# Patient Record
Sex: Female | Born: 1960 | Race: White | Hispanic: No | Marital: Married | Smoking: Current every day smoker
Health system: Southern US, Community
[De-identification: ages and names within clinical notes are randomized; demographics above are authoritative.]

## PROBLEM LIST (undated history)

## (undated) DIAGNOSIS — M461 Sacroiliitis, not elsewhere classified: Secondary | ICD-10-CM

## (undated) DIAGNOSIS — F32A Depression, unspecified: Secondary | ICD-10-CM

## (undated) DIAGNOSIS — T50901A Poisoning by unspecified drugs, medicaments and biological substances, accidental (unintentional), initial encounter: Secondary | ICD-10-CM

## (undated) DIAGNOSIS — F419 Anxiety disorder, unspecified: Secondary | ICD-10-CM

## (undated) DIAGNOSIS — K589 Irritable bowel syndrome without diarrhea: Secondary | ICD-10-CM

## (undated) DIAGNOSIS — M170 Bilateral primary osteoarthritis of knee: Secondary | ICD-10-CM

## (undated) DIAGNOSIS — F329 Major depressive disorder, single episode, unspecified: Secondary | ICD-10-CM

## (undated) DIAGNOSIS — N879 Dysplasia of cervix uteri, unspecified: Secondary | ICD-10-CM

## (undated) DIAGNOSIS — J449 Chronic obstructive pulmonary disease, unspecified: Secondary | ICD-10-CM

## (undated) DIAGNOSIS — K219 Gastro-esophageal reflux disease without esophagitis: Secondary | ICD-10-CM

## (undated) HISTORY — DX: Bilateral primary osteoarthritis of knee: M17.0

## (undated) HISTORY — DX: Anxiety disorder, unspecified: F41.9

## (undated) HISTORY — PX: TONSILLECTOMY AND ADENOIDECTOMY: SHX28

## (undated) HISTORY — DX: Chronic obstructive pulmonary disease, unspecified: J44.9

## (undated) HISTORY — DX: Major depressive disorder, single episode, unspecified: F32.9

## (undated) HISTORY — PX: TUBAL LIGATION: SHX77

## (undated) HISTORY — DX: Poisoning by unspecified drugs, medicaments and biological substances, accidental (unintentional), initial encounter: T50.901A

## (undated) HISTORY — DX: Depression, unspecified: F32.A

## (undated) HISTORY — DX: Gastro-esophageal reflux disease without esophagitis: K21.9

## (undated) HISTORY — DX: Irritable bowel syndrome, unspecified: K58.9

## (undated) HISTORY — DX: Sacroiliitis, not elsewhere classified: M46.1

## (undated) HISTORY — DX: Dysplasia of cervix uteri, unspecified: N87.9

---

## 2000-08-21 ENCOUNTER — Other Ambulatory Visit: Admission: RE | Admit: 2000-08-21 | Discharge: 2000-08-21 | Payer: Self-pay | Admitting: Internal Medicine

## 2001-08-24 ENCOUNTER — Other Ambulatory Visit: Admission: RE | Admit: 2001-08-24 | Discharge: 2001-08-24 | Payer: Self-pay | Admitting: Internal Medicine

## 2003-07-07 ENCOUNTER — Other Ambulatory Visit: Admission: RE | Admit: 2003-07-07 | Discharge: 2003-07-07 | Payer: Self-pay | Admitting: Internal Medicine

## 2004-06-18 ENCOUNTER — Ambulatory Visit: Payer: Self-pay | Admitting: Internal Medicine

## 2004-10-14 ENCOUNTER — Ambulatory Visit: Payer: Self-pay | Admitting: Internal Medicine

## 2004-12-05 ENCOUNTER — Ambulatory Visit: Payer: Self-pay | Admitting: Internal Medicine

## 2005-03-19 ENCOUNTER — Other Ambulatory Visit: Admission: RE | Admit: 2005-03-19 | Discharge: 2005-03-19 | Payer: Self-pay | Admitting: Internal Medicine

## 2005-03-19 ENCOUNTER — Ambulatory Visit: Payer: Self-pay | Admitting: Internal Medicine

## 2005-04-08 ENCOUNTER — Ambulatory Visit: Payer: Self-pay | Admitting: Internal Medicine

## 2005-05-28 ENCOUNTER — Ambulatory Visit: Payer: Self-pay | Admitting: Internal Medicine

## 2005-09-26 ENCOUNTER — Ambulatory Visit: Payer: Self-pay | Admitting: Internal Medicine

## 2006-04-10 ENCOUNTER — Ambulatory Visit: Payer: Self-pay | Admitting: Internal Medicine

## 2006-07-29 ENCOUNTER — Telehealth (INDEPENDENT_AMBULATORY_CARE_PROVIDER_SITE_OTHER): Payer: Self-pay | Admitting: *Deleted

## 2006-10-23 ENCOUNTER — Telehealth (INDEPENDENT_AMBULATORY_CARE_PROVIDER_SITE_OTHER): Payer: Self-pay | Admitting: *Deleted

## 2006-11-11 ENCOUNTER — Encounter: Payer: Self-pay | Admitting: Internal Medicine

## 2006-11-11 DIAGNOSIS — K589 Irritable bowel syndrome without diarrhea: Secondary | ICD-10-CM

## 2006-11-11 DIAGNOSIS — M461 Sacroiliitis, not elsewhere classified: Secondary | ICD-10-CM

## 2006-11-17 ENCOUNTER — Encounter: Payer: Self-pay | Admitting: Internal Medicine

## 2006-11-17 ENCOUNTER — Ambulatory Visit: Payer: Self-pay | Admitting: Internal Medicine

## 2006-11-17 ENCOUNTER — Other Ambulatory Visit: Admission: RE | Admit: 2006-11-17 | Discharge: 2006-11-17 | Payer: Self-pay | Admitting: Internal Medicine

## 2006-11-17 DIAGNOSIS — F411 Generalized anxiety disorder: Secondary | ICD-10-CM | POA: Insufficient documentation

## 2006-11-17 DIAGNOSIS — N939 Abnormal uterine and vaginal bleeding, unspecified: Secondary | ICD-10-CM

## 2006-11-17 DIAGNOSIS — N926 Irregular menstruation, unspecified: Secondary | ICD-10-CM | POA: Insufficient documentation

## 2006-11-23 ENCOUNTER — Telehealth (INDEPENDENT_AMBULATORY_CARE_PROVIDER_SITE_OTHER): Payer: Self-pay | Admitting: *Deleted

## 2006-12-28 ENCOUNTER — Telehealth (INDEPENDENT_AMBULATORY_CARE_PROVIDER_SITE_OTHER): Payer: Self-pay | Admitting: *Deleted

## 2006-12-30 ENCOUNTER — Telehealth: Payer: Self-pay | Admitting: Internal Medicine

## 2007-01-12 ENCOUNTER — Telehealth (INDEPENDENT_AMBULATORY_CARE_PROVIDER_SITE_OTHER): Payer: Self-pay | Admitting: *Deleted

## 2007-02-15 ENCOUNTER — Telehealth (INDEPENDENT_AMBULATORY_CARE_PROVIDER_SITE_OTHER): Payer: Self-pay | Admitting: *Deleted

## 2007-03-11 ENCOUNTER — Telehealth (INDEPENDENT_AMBULATORY_CARE_PROVIDER_SITE_OTHER): Payer: Self-pay | Admitting: *Deleted

## 2007-03-16 ENCOUNTER — Telehealth (INDEPENDENT_AMBULATORY_CARE_PROVIDER_SITE_OTHER): Payer: Self-pay | Admitting: *Deleted

## 2007-04-06 ENCOUNTER — Telehealth: Payer: Self-pay | Admitting: Internal Medicine

## 2007-04-12 ENCOUNTER — Telehealth (INDEPENDENT_AMBULATORY_CARE_PROVIDER_SITE_OTHER): Payer: Self-pay | Admitting: *Deleted

## 2007-05-17 ENCOUNTER — Telehealth (INDEPENDENT_AMBULATORY_CARE_PROVIDER_SITE_OTHER): Payer: Self-pay | Admitting: *Deleted

## 2007-06-10 ENCOUNTER — Telehealth (INDEPENDENT_AMBULATORY_CARE_PROVIDER_SITE_OTHER): Payer: Self-pay | Admitting: *Deleted

## 2007-07-08 ENCOUNTER — Telehealth (INDEPENDENT_AMBULATORY_CARE_PROVIDER_SITE_OTHER): Payer: Self-pay | Admitting: *Deleted

## 2007-07-28 ENCOUNTER — Telehealth (INDEPENDENT_AMBULATORY_CARE_PROVIDER_SITE_OTHER): Payer: Self-pay | Admitting: *Deleted

## 2007-08-05 ENCOUNTER — Telehealth (INDEPENDENT_AMBULATORY_CARE_PROVIDER_SITE_OTHER): Payer: Self-pay | Admitting: *Deleted

## 2007-08-09 ENCOUNTER — Encounter: Payer: Self-pay | Admitting: Internal Medicine

## 2007-09-14 ENCOUNTER — Telehealth (INDEPENDENT_AMBULATORY_CARE_PROVIDER_SITE_OTHER): Payer: Self-pay | Admitting: *Deleted

## 2007-10-14 ENCOUNTER — Telehealth (INDEPENDENT_AMBULATORY_CARE_PROVIDER_SITE_OTHER): Payer: Self-pay | Admitting: *Deleted

## 2007-11-12 ENCOUNTER — Telehealth: Payer: Self-pay | Admitting: Family Medicine

## 2007-11-16 ENCOUNTER — Telehealth: Payer: Self-pay | Admitting: Internal Medicine

## 2007-12-27 ENCOUNTER — Ambulatory Visit: Payer: Self-pay | Admitting: Internal Medicine

## 2008-01-24 ENCOUNTER — Telehealth: Payer: Self-pay | Admitting: Internal Medicine

## 2008-02-17 ENCOUNTER — Ambulatory Visit: Payer: Self-pay | Admitting: Anesthesiology

## 2008-03-21 ENCOUNTER — Telehealth: Payer: Self-pay | Admitting: Internal Medicine

## 2008-05-31 ENCOUNTER — Telehealth: Payer: Self-pay | Admitting: Internal Medicine

## 2008-08-01 ENCOUNTER — Ambulatory Visit: Payer: Self-pay | Admitting: Anesthesiology

## 2008-08-17 ENCOUNTER — Telehealth: Payer: Self-pay | Admitting: Internal Medicine

## 2008-10-16 ENCOUNTER — Telehealth: Payer: Self-pay | Admitting: Internal Medicine

## 2008-11-23 ENCOUNTER — Telehealth: Payer: Self-pay | Admitting: Internal Medicine

## 2008-11-23 ENCOUNTER — Encounter: Payer: Self-pay | Admitting: Internal Medicine

## 2008-12-13 ENCOUNTER — Ambulatory Visit: Payer: Self-pay | Admitting: Anesthesiology

## 2009-06-01 ENCOUNTER — Ambulatory Visit: Payer: Self-pay | Admitting: Anesthesiology

## 2011-10-31 ENCOUNTER — Ambulatory Visit (INDEPENDENT_AMBULATORY_CARE_PROVIDER_SITE_OTHER): Payer: Self-pay | Admitting: Internal Medicine

## 2011-10-31 ENCOUNTER — Encounter: Payer: Self-pay | Admitting: Internal Medicine

## 2011-10-31 VITALS — BP 117/87 | HR 82 | Temp 98.1°F | Ht 62.0 in | Wt 134.2 lb

## 2011-10-31 DIAGNOSIS — F329 Major depressive disorder, single episode, unspecified: Secondary | ICD-10-CM

## 2011-10-31 DIAGNOSIS — N879 Dysplasia of cervix uteri, unspecified: Secondary | ICD-10-CM | POA: Insufficient documentation

## 2011-10-31 DIAGNOSIS — M461 Sacroiliitis, not elsewhere classified: Secondary | ICD-10-CM

## 2011-10-31 DIAGNOSIS — F339 Major depressive disorder, recurrent, unspecified: Secondary | ICD-10-CM | POA: Insufficient documentation

## 2011-10-31 DIAGNOSIS — M170 Bilateral primary osteoarthritis of knee: Secondary | ICD-10-CM | POA: Insufficient documentation

## 2011-10-31 DIAGNOSIS — F411 Generalized anxiety disorder: Secondary | ICD-10-CM

## 2011-10-31 DIAGNOSIS — J449 Chronic obstructive pulmonary disease, unspecified: Secondary | ICD-10-CM | POA: Insufficient documentation

## 2011-10-31 DIAGNOSIS — K219 Gastro-esophageal reflux disease without esophagitis: Secondary | ICD-10-CM | POA: Insufficient documentation

## 2011-10-31 MED ORDER — HYDROCODONE-ACETAMINOPHEN 5-325 MG PO TABS: 1.0000 | ORAL_TABLET | Freq: Three times a day (TID) | ORAL | Status: AC | PRN

## 2011-10-31 MED ORDER — CARISOPRODOL 350 MG PO TABS: 350.0000 mg | ORAL_TABLET | Freq: Three times a day (TID) | ORAL | Status: AC | PRN

## 2011-10-31 MED ORDER — CITALOPRAM HYDROBROMIDE 20 MG PO TABS: 20.0000 mg | ORAL_TABLET | Freq: Every day | ORAL | Status: DC

## 2011-10-31 NOTE — Patient Instructions (Addendum)
We will plan to repeat your pap smear at the next visit Please check into the [place] reduced payment plan

## 2011-10-31 NOTE — Assessment & Plan Note (Signed)
Ongoing Will try the citalopram Will not give xanax again unless she goes off the soma

## 2011-10-31 NOTE — Assessment & Plan Note (Signed)
Chronic ongoing pain Will restart hydrocodone and soma---in lower doses Discussed dependence concerns

## 2011-10-31 NOTE — Progress Notes (Signed)
Subjective:    Patient ID: Vanessa Turner, female    DOB: [birth year], 51 y.o.   MRN: [id number]  HPI Reestablishing here Has relocated to this area  Ongoing medical issues Chronic sacroiliitis Now with knee osteoarthritis as well Was Rx with soma and hydrocodone by MD around [facility] where she was living This did help but she has been out of meds for a while  Ongoing anxiety Xanax in past Also with chronic depression Worse since daughter's death in 12/11---had severe CP and put her in home at urging of her brother and sister. Multiple complications from aspiration and finally died Ongoing guilt due to this  Trouble with mouth and teeth Needs plate On penicillin  Ongoing breathing problems Still smoking Had to retire from job recently due to dyspnea--could bring mop up 3 staircases as was required Still smokes and not ready to stop  No current outpatient prescriptions on file prior to visit.    Allergies  Allergen Reactions  . Cephalexin     REACTION: unspecified  . Clarithromycin     REACTION: unspecified  . Fluoxetine Hcl     REACTION: increasd depression  . Paroxetine     REACTION: no help  . Prednisone     REACTION: unspecified    Past Medical History  Diagnosis Date  . GERD (gastroesophageal reflux disease)   . Anxiety   . Depression     since daughter's death March 20, 2023  . Sacroiliitis     chronic--has needed pain management  . IBS (irritable bowel syndrome)   . Osteoarthritis of both knees   . COPD (chronic obstructive pulmonary disease)     Past Surgical History  Procedure Date  . Tubal ligation   . Tonsillectomy and adenoidectomy     Family History  Problem Relation Age of Onset  . Heart disease Neg Hx   . Hypertension Neg Hx     History   Social History  . Marital Status: Legally Separated    Spouse Name: N/A    Number of Children: 2  . Years of Education: N/A   Occupational History  . Out of work --[employer] in past    Social  History Main Topics  . Smoking status: Current Everyday Smoker  . Smokeless tobacco: Not on file  . Alcohol Use: No  . Drug Use: No  . Sexually Active: Not on file   Other Topics Concern  . Not on file   Social History Narrative   Still legally seperatedSon living with herLooking for workDaughter died 2023-03-20   Review of Systems  Constitutional: Positive for fatigue. Negative for unexpected weight change.       Some tired feelings she relates to depression  Respiratory: Positive for cough, chest tightness and shortness of breath.        Chest tightness due to dyspnea  Cardiovascular: Negative for chest pain and palpitations.  Gastrointestinal: Negative for nausea, abdominal pain and constipation.       Uses occ gaviscon for reflux  Genitourinary: Negative for dysuria and urgency.  Musculoskeletal: Positive for back pain and arthralgias. Negative for joint swelling.  Skin: Negative for color change and rash.  Neurological: Positive for headaches. Negative for dizziness, syncope and light-headedness.       Occ headaches  Hematological: Negative for adenopathy. Does not bruise/bleed easily.  Psychiatric/Behavioral: Positive for disturbed wake/sleep cycle and dysphoric mood. The patient is nervous/anxious.        Objective:   Physical Exam  Constitutional: She appears well-developed. No  distress.  HENT:  Mouth/Throat: Oropharynx is clear and moist. No oropharyngeal exudate.  Eyes: Conjunctivae and EOM are normal.  Neck: Normal range of motion. Neck supple. No thyromegaly present.  Cardiovascular: Normal rate, regular rhythm and normal heart sounds.  Exam reveals no gallop.   No murmur heard. Pulmonary/Chest: Effort normal and breath sounds normal. No respiratory distress. She has no wheezes. She has no rales.  Abdominal: Soft. There is no tenderness.  Musculoskeletal: She exhibits no edema.       Mild S-I tenderness  Lymphadenopathy:    She has no cervical adenopathy.  Skin:  No rash noted.  Psychiatric:       Appears depressed Affect somewhat flattened          Assessment & Plan:

## 2011-10-31 NOTE — Assessment & Plan Note (Signed)
Long standing Did well with effexor and lexapro in past Will restart citalopram

## 2011-11-30 DIAGNOSIS — T50901A Poisoning by unspecified drugs, medicaments and biological substances, accidental (unintentional), initial encounter: Secondary | ICD-10-CM

## 2011-11-30 HISTORY — DX: Poisoning by unspecified drugs, medicaments and biological substances, accidental (unintentional), initial encounter: T50.901A

## 2011-12-02 ENCOUNTER — Other Ambulatory Visit: Payer: Self-pay | Admitting: *Deleted

## 2011-12-02 ENCOUNTER — Encounter: Payer: Self-pay | Admitting: Internal Medicine

## 2011-12-02 ENCOUNTER — Ambulatory Visit (INDEPENDENT_AMBULATORY_CARE_PROVIDER_SITE_OTHER): Payer: Self-pay | Admitting: Internal Medicine

## 2011-12-02 VITALS — BP 100/60 | HR 82 | Temp 98.0°F | Ht 62.0 in | Wt 125.0 lb

## 2011-12-02 DIAGNOSIS — M461 Sacroiliitis, not elsewhere classified: Secondary | ICD-10-CM

## 2011-12-02 DIAGNOSIS — F411 Generalized anxiety disorder: Secondary | ICD-10-CM

## 2011-12-02 MED ORDER — HYDROCODONE-ACETAMINOPHEN 10-325 MG PO TABS: 1.0000 | ORAL_TABLET | Freq: Three times a day (TID) | ORAL | Status: AC | PRN

## 2011-12-02 MED ORDER — CITALOPRAM HYDROBROMIDE 40 MG PO TABS: 40.0000 mg | ORAL_TABLET | Freq: Every day | ORAL | Status: DC

## 2011-12-02 MED ORDER — ALPRAZOLAM 0.25 MG PO TABS: 0.2500 mg | ORAL_TABLET | Freq: Three times a day (TID) | ORAL | Status: AC | PRN

## 2011-12-02 NOTE — Progress Notes (Signed)
  Subjective:    Patient ID: Vanessa Turner, female    DOB: Mar 12, 1961, 51 y.o.   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 citalopram (CELEXA) 20 MG tablet Take 1 tablet (20 mg total) by mouth daily.  30 tablet  11  . penicillin v potassium (VEETID) 500 MG tablet Take 500 mg by mouth 4 (four) times daily.        Allergies  Allergen Reactions  . Cephalexin     REACTION: unspecified  . Clarithromycin     REACTION: unspecified  . Fluoxetine Hcl     REACTION: increasd depression  . Paroxetine     REACTION: no help  . Prednisone     REACTION: unspecified    Past Medical History  Diagnosis Date  . GERD (gastroesophageal reflux disease)   . Anxiety   . Depression     since daughter's death 14-Mar-2023  . Sacroiliitis     chronic--has needed pain management  . IBS (irritable bowel syndrome)   . Osteoarthritis of both knees   . COPD (chronic obstructive pulmonary disease)   . Cervical dysplasia     cryotherapy 2009    Past Surgical History  Procedure Date  . Tubal ligation   . Tonsillectomy and adenoidectomy     Family History  Problem Relation Age of Onset  . Heart disease Neg Hx   . Hypertension Neg Hx     History   Social History  . Marital Status: Legally Separated    Spouse Name: N/A    Number of Children: 2  . Years of Education: N/A   Occupational History  . Out of work --[employer] in past    Social History Main  Topics  . Smoking status: Current Everyday Smoker  . Smokeless tobacco: Never Used   Comment: gave 1-800-quit-now  . Alcohol Use: No  . Drug Use: No  . Sexually Active: Not on file   Other Topics Concern  . Not on file   Social History Narrative   Still legally seperatedSon living with herLooking for workDaughter died 14-Mar-2023   Review of Systems Eating is not great Weight stable    Objective:   Physical Exam  Psychiatric:       Still anxious dysthymic          Assessment & Plan:

## 2011-12-02 NOTE — Assessment & Plan Note (Signed)
Ongoing problems Little response to citalopram---will increase dose Xanax again (no soma anymore)

## 2011-12-02 NOTE — Patient Instructions (Signed)
Please try the [facility] for your care. If this doesn't work out, call for an appt here again

## 2011-12-02 NOTE — Assessment & Plan Note (Signed)
Will increase the hydrocodone dose

## 2011-12-25 ENCOUNTER — Inpatient Hospital Stay: Payer: Self-pay | Admitting: Psychiatry

## 2011-12-25 ENCOUNTER — Telehealth: Payer: Self-pay | Admitting: *Deleted

## 2011-12-25 NOTE — Telephone Encounter (Signed)
Okay I will check on her there

## 2011-12-25 NOTE — Telephone Encounter (Signed)
Call from [facility] requesting last office notes and med list for patient, she's being admitted for overdose. Did receive record release form, records faxed.

## 2011-12-26 ENCOUNTER — Telehealth: Payer: Self-pay | Admitting: Internal Medicine

## 2011-12-26 NOTE — Telephone Encounter (Signed)
Spoke to patient at [facility] behavioral med Overdose of soma--took due to pain No suicidal ideation Hopes to leave soon Has follow up next Thursday  Then call from Dr Marciano Sequin Kapur--psychiatrist Committed due to overdose Bought the soma on the street Probably will leave on Monday Getting set up with reduced fee psychiatric services and counselor Didn't want to give her hydrocodone if I wasn't going to continue it I have treated her for a long time and she has a long history of pain management care I will only Rx hydrocodone---not xanax or soma  Also on probation for DUI

## 2011-12-30 ENCOUNTER — Ambulatory Visit: Payer: Self-pay | Admitting: Internal Medicine

## 2011-12-30 HISTORY — PX: SPLENECTOMY, TOTAL: SHX788

## 2012-01-01 ENCOUNTER — Ambulatory Visit (INDEPENDENT_AMBULATORY_CARE_PROVIDER_SITE_OTHER): Payer: Self-pay | Admitting: Internal Medicine

## 2012-01-01 ENCOUNTER — Encounter: Payer: Self-pay | Admitting: Internal Medicine

## 2012-01-01 VITALS — BP 134/92 | HR 72 | Temp 99.0°F | Wt 128.5 lb

## 2012-01-01 DIAGNOSIS — K044 Acute apical periodontitis of pulpal origin: Secondary | ICD-10-CM

## 2012-01-01 DIAGNOSIS — K047 Periapical abscess without sinus: Secondary | ICD-10-CM | POA: Insufficient documentation

## 2012-01-01 DIAGNOSIS — M461 Sacroiliitis, not elsewhere classified: Secondary | ICD-10-CM

## 2012-01-01 DIAGNOSIS — T50901A Poisoning by unspecified drugs, medicaments and biological substances, accidental (unintentional), initial encounter: Secondary | ICD-10-CM

## 2012-01-01 MED ORDER — AMOXICILLIN 500 MG PO TABS: 1000.0000 mg | ORAL_TABLET | Freq: Two times a day (BID) | ORAL | Status: DC

## 2012-01-01 MED ORDER — HYDROCODONE-ACETAMINOPHEN 10-325 MG PO TABS: 1.0000 | ORAL_TABLET | Freq: Three times a day (TID) | ORAL | Status: DC | PRN

## 2012-01-01 NOTE — Assessment & Plan Note (Signed)
Chronic pain Considering going back to pain clinic at [facility] I will continue to prescribe 90 hydrocodone monthly

## 2012-01-01 NOTE — Assessment & Plan Note (Signed)
Accidental overdose she claims No suicidal ideation Now getting psychiatry follow up and psychologist

## 2012-01-01 NOTE — Assessment & Plan Note (Signed)
Will Rx amoxil Can't afford definitive care

## 2012-01-01 NOTE — Progress Notes (Signed)
  Subjective:    Patient ID: Vanessa Turner, female    DOB: Feb 10, 1961, 51 y.o.   MRN: [id number]  HPI Had overdose of soma Took too much due to pain--kept taking it Hospitalized for several days in behavioral med Likely goes back to grieving and guilt over daughter's death  Has gone for intake for psychiatrist and counselor  Has infected tooth Due for dentist appt with Dr Blima Rich likely need them extracted Just got bad last night  Current Outpatient Prescriptions on File Prior to Visit  Medication Sig Dispense Refill  . QUEtiapine Fumarate (SEROQUEL XR) 150 MG 24 hr tablet Take 150 mg by mouth at bedtime.      . ALPRAZolam (XANAX) 0.25 MG tablet Take 1 tablet (0.25 mg total) by mouth 3 (three) times daily as needed for sleep.  90 tablet  0  . citalopram (CELEXA) 40 MG tablet Take 1 tablet (40 mg total) by mouth daily.  30 tablet  3  . penicillin v potassium (VEETID) 500 MG tablet Take 500 mg by mouth 4 (four) times daily.        Allergies  Allergen Reactions  . Cephalexin     REACTION: unspecified  . Clarithromycin     REACTION: unspecified  . Fluoxetine Hcl     REACTION: increasd depression  . Paroxetine     REACTION: no help  . Prednisone     REACTION: unspecified    Past Medical History  Diagnosis Date  . GERD (gastroesophageal reflux disease)   . Anxiety   . Depression     since daughter's death 03-17-23  . Sacroiliitis     chronic--has needed pain management  . IBS (irritable bowel syndrome)   . Osteoarthritis of both knees   . COPD (chronic obstructive pulmonary disease)   . Cervical dysplasia     cryotherapy 2009  . Overdose of drug 9/13    soma--?accidental    Past Surgical History  Procedure Date  . Tubal ligation   . Tonsillectomy and adenoidectomy     Family History  Problem Relation Age of Onset  . Heart disease Neg Hx   . Hypertension Neg Hx     History   Social History  . Marital Status: Legally Separated    Spouse Name: N/A   Number of Children: 2  . Years of Education: N/A   Occupational History  . Out of work --[employer] in past    Social History Main Topics  . Smoking status: Current Every Day Smoker  . Smokeless tobacco: Never Used   Comment: gave 1-800-quit-now  . Alcohol Use: No  . Drug Use: No  . Sexually Active: Not on file   Other Topics Concern  . Not on file   Social History Narrative   Still legally seperatedSon living with herLooking for workDaughter died 03-17-23   Review of Systems     Objective:   Physical Exam  Constitutional: She appears well-developed and well-nourished.  HENT:       Mandibular swelling on left with mild tenderness Redness at left rear molar with some tenderness  Psychiatric: She has a normal mood and affect. Her behavior is normal.          Assessment & Plan:

## 2012-01-09 ENCOUNTER — Ambulatory Visit: Payer: Self-pay | Admitting: Internal Medicine

## 2012-01-26 ENCOUNTER — Inpatient Hospital Stay: Payer: Self-pay | Admitting: Surgery

## 2012-01-27 ENCOUNTER — Other Ambulatory Visit: Payer: Self-pay | Admitting: *Deleted

## 2012-01-27 MED ORDER — HYDROCODONE-ACETAMINOPHEN 10-325 MG PO TABS: 1.0000 | ORAL_TABLET | Freq: Three times a day (TID) | ORAL | Status: DC | PRN

## 2012-01-27 NOTE — Addendum Note (Signed)
Addended by: Sueanne Margarita on: 01/27/2012 03:40 PM   Modules accepted: Orders

## 2012-01-27 NOTE — Telephone Encounter (Signed)
Okay #90 x 0 

## 2012-01-27 NOTE — Telephone Encounter (Signed)
rx called into pharmacy

## 2012-01-30 ENCOUNTER — Ambulatory Visit: Payer: Self-pay | Admitting: Internal Medicine

## 2012-02-27 ENCOUNTER — Telehealth: Payer: Self-pay | Admitting: *Deleted

## 2012-02-27 NOTE — Telephone Encounter (Signed)
Decline to refill.  Too early - I believe I understand Dr. Karle Starch prescribing patterns well enough to know this is how he would like this handled.    (Notes reviewed and recent OD noted)

## 2012-02-27 NOTE — Telephone Encounter (Signed)
Spoke with patient and advised results, she doesn't like it but she will wait for Dr.[name]. 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

## 2012-02-27 NOTE — Telephone Encounter (Signed)
Patient calling asking for a early refill on Hydrocodone, patient got refills 01/01/12 & 01/27/12 patient not due until 03/02/12 and she knows this, but was asking for refill today. I advised pt that I couldn't promise that another physican could do this (see last OV)

## 2012-02-28 NOTE — Telephone Encounter (Signed)
Please call her If she had surgery, she should be able to get a refill from the surgeon

## 2012-02-29 ENCOUNTER — Ambulatory Visit: Payer: Self-pay | Admitting: Internal Medicine

## 2012-03-01 ENCOUNTER — Other Ambulatory Visit: Payer: Self-pay | Admitting: Internal Medicine

## 2012-03-01 ENCOUNTER — Other Ambulatory Visit: Payer: Self-pay

## 2012-03-01 NOTE — Telephone Encounter (Signed)
She notes that all these doctors are part of the same medical practice  I  told her she should still have the hydrocodone that I Rx'd 10/28 She states that she was in hospital then and she didn't pick it up I told her that if she didn't pick it up and someone else did without her permission---we will need to call the police  Told her I couldn't refill any pain meds now She will look into going back to the pain clinic at [facility] She will let me know if she needs a referral  Adrienne,  Please check the log for the October hydrocodone prescription for her.  If someone signed her name, we need to call the police to report this (or at least talk to Arlys John about how we should handle it---might want to review it with her again before we call)  Also, please cancel her appt for tomorrow

## 2012-03-01 NOTE — Telephone Encounter (Signed)
Pt left v/m requesting refill hydrocodone apap; pt has had recent surgery and taking med more often due to surgery. Pt request refill to [facility]. Pt request call back.

## 2012-03-01 NOTE — Telephone Encounter (Signed)
See phone note / refill request from 02/27/2012

## 2012-03-01 NOTE — Telephone Encounter (Signed)
Spoke with [facility] and pt was seen and released on 02/25/2012 pt got refill of Oxycodone 5-325 take 1 q6h got #40 also diazepam 5mg  1 q12h got #20, per [facility] pt is to follow up with [facility] pain center. I called [facility] pain center and pt has not been there since 2008. [facility] will fax over office notes, pt is to only follow-up with as needed but she was released because she was doing better. Pt was last seen by Dr. Alphonsus Sias on 01/01/2012 for overdose of Soma. Please advise on refill, pt came in office and left, stated she couldn't wait. Pt would like call back.

## 2012-03-01 NOTE — Telephone Encounter (Signed)
Patient was very upset, she wanted to schedule an appt with Dr.[name] but patient does not have any insurance and would need at least $125.00 to be seen because pt already have a balance. Per Hansel Starling pt would need to pay something in order to be seen and per pt she doesn't have any money, just enough to get her rx's filled.  Pt states she wants Dr.[name] to call her personally. Pt states the surgeon office will not give her anymore refills and they need to come from [facility]. I explained to pt that she was getting multiple refills from other physicians and she states that she didn't pick those rx's up, because she was in the hospital. [facility] confirmed that rx's where picked up. Patient states she will be waiting for a call from Dr.[name] if not she will be back.[phone]

## 2012-03-01 NOTE — Telephone Encounter (Signed)
Call from [facility] in [place] stating that pt is getting multiple refills:  02/09/12 Dr.Cooper-- percocet 02/11/12 Dr. Colette Ribas----- percocet 02/19/12 Dr. Juliann Pulse Vicodin 5-325  Also on 01/27/12 pt got #90 vicodin from Dr.[name] not sure where this was filled at, last refill at [facility] was 10/31/2011

## 2012-03-01 NOTE — Telephone Encounter (Signed)
Per patient she had her spleen taken out before halloween, she is seeing the surgeon for follow-up and per pt they have given her all the refills she can get, pt states she can get a refill tomorrow but was hoping Dr.[name] could refill early. Pt states she's seeing Dr. Christa See? Is that with [facility]? There's no notes from [facility], should I call for notes? Please advise

## 2012-03-01 NOTE — Telephone Encounter (Signed)
Please let her know that I cannot prescribe any narcotics because she is getting meds from too many people She should establish with the pain specialist and they would be the only one to prescribe any narcotics

## 2012-03-02 ENCOUNTER — Encounter: Payer: Self-pay | Admitting: Internal Medicine

## 2012-03-02 ENCOUNTER — Ambulatory Visit: Payer: Self-pay | Admitting: Internal Medicine

## 2012-03-02 NOTE — Telephone Encounter (Signed)
Spoke with pharmacist at [facility] and the rx on 01/27/12 was phoned in to [facility] which has been closed for about 2 weeks. [facility] took [facility]. Pharmacist states that rx was phoned in on 01/27/12 and picked up on 02/03/2012 pt paid $31.95 for rx, they would not fill until 02/03/12 because pt had just had another rx on 01/03/12 so it was too soon.

## 2012-03-03 ENCOUNTER — Telehealth: Payer: Self-pay

## 2012-03-03 DIAGNOSIS — M461 Sacroiliitis, not elsewhere classified: Secondary | ICD-10-CM

## 2012-03-03 NOTE — Telephone Encounter (Signed)
Pt left v/m requesting referral to pain clinic (pt cannot go to [facility] pain clinic due to insurance and cost of visits). Pt also request pain med until can be seen at pain clinic. Pt request call back.

## 2012-03-04 MED ORDER — HYDROCODONE-ACETAMINOPHEN 10-325 MG PO TABS: 1.0000 | ORAL_TABLET | Freq: Three times a day (TID) | ORAL | Status: DC | PRN

## 2012-03-04 NOTE — Telephone Encounter (Signed)
Yes  Don't know what happened to the note I wrote Referral made also  Phoned in #30 of norco Will not fill any more than this at a time Awaiting pain evaluation

## 2012-03-04 NOTE — Telephone Encounter (Signed)
Dr.[name] did you ok pain medication for this patient ? Please advise

## 2012-03-11 ENCOUNTER — Other Ambulatory Visit: Payer: Self-pay

## 2012-03-11 MED ORDER — HYDROCODONE-ACETAMINOPHEN 10-325 MG PO TABS: 1.0000 | ORAL_TABLET | Freq: Three times a day (TID) | ORAL | Status: DC | PRN

## 2012-03-11 NOTE — Telephone Encounter (Signed)
Pt request refill Hydrocodone-apap to [facility]. Pt still has med but will run out over week end. Pt has no insurance; Vanessa Turner working on pain mgt appt at [employer].Please advise. Pt request call back when med called in.

## 2012-03-11 NOTE — Telephone Encounter (Signed)
See phone note

## 2012-03-11 NOTE — Telephone Encounter (Signed)
Okay to phone in another #30 x 0 of norco Won't be due again till 12/24

## 2012-03-11 NOTE — Addendum Note (Signed)
Addended by: Patience Musca on: 03/11/2012 09:15 AM   Modules accepted: Orders

## 2012-03-11 NOTE — Telephone Encounter (Addendum)
Pt request refill Hydrocodone-apap to [facility]. Pt still has med but will run out over week end. Pt has no insurance; Armando Reichert working on pain mgt appt at [facility] [employer].Please advise. Pt request call back when med called in. Had to start new phone note to list med under meds and orders section.

## 2012-03-11 NOTE — Telephone Encounter (Signed)
rx called into pharmacy Spoke with patient and advised results   

## 2012-03-22 ENCOUNTER — Other Ambulatory Visit: Payer: Self-pay

## 2012-03-22 ENCOUNTER — Other Ambulatory Visit: Payer: Self-pay | Admitting: Internal Medicine

## 2012-03-22 NOTE — Telephone Encounter (Signed)
Pt left v/m requesting status of refill; pt request call back at [phone].

## 2012-03-22 NOTE — Telephone Encounter (Signed)
rx called into pharmacy Spoke with Shirlee Limerick and she has done the referral to [facility] pain clinic and they will contact patient in 2 weeks, referral faxed 03/11/12.  Spoke with patient and she haven't heard anything from [facility], but she wanted to make sure she could continue to get refills until the pain clinic appt

## 2012-03-22 NOTE — Telephone Encounter (Signed)
Okay #30 x 0 Find out if she at least got an appt with pain center (I know it might be a couple of months away)

## 2012-03-22 NOTE — Telephone Encounter (Signed)
Pt left v/m requesting refill hydrocodone-apap to [facility].Please advise.

## 2012-03-22 NOTE — Telephone Encounter (Signed)
Pt left v/m requesting status of refill request and pt wants call back at [phone].

## 2012-03-22 NOTE — Telephone Encounter (Signed)
This is a duplicate Okay #30 x 0 Find out if she got a pain center appt

## 2012-03-29 ENCOUNTER — Telehealth: Payer: Self-pay | Admitting: Internal Medicine

## 2012-03-29 NOTE — Telephone Encounter (Signed)
Called [facility], they have received our request for Referral and patient is on a waiting list, they will be contacting the patient directly and they said it could take months to get her an appt maybe after March 2014. Called the patient also to ask her to call Dr Alphonsus Sias as soon as they ca;; her with an appt with the [facility].

## 2012-04-01 ENCOUNTER — Other Ambulatory Visit: Payer: Self-pay | Admitting: Internal Medicine

## 2012-04-01 NOTE — Telephone Encounter (Signed)
Pt left v/m requesting status of hydrocodone refill; pt request call back at [phone].

## 2012-04-01 NOTE — Telephone Encounter (Signed)
Caller: Vanessa Turner/Patient; Phone: [phone]; Reason for Call: Time for renewel for Hydrocodone 10j/325.  [facility] [address]  Has faxed a request but not heard anything.  Nothing noted in EPIC EMR regarding refill request.  Pt.  Has been referred to Pain Clinic at [facility] but per EPIC note may beMarch 2014 before appt.  Currently getting a 10 day supply but asks if possible for expense purposes if she could get a 30 day supply at a time as it's $35 compared to $20 for 10 days.  Pt.  Relays that at time of return call she was in her car to drive to office and see about same request.  She has dose for 04/02/12 but then will be out.

## 2012-04-01 NOTE — Telephone Encounter (Signed)
Pt left note stating what CAN advises. Pt request call back at [phone].

## 2012-04-01 NOTE — Telephone Encounter (Signed)
Patient walked in office asking that refill be done now, front desk staff advised to patient that Dr.[name] was out and rx was routed to another provider, pt didn't like that and asked that refill be done now, again we advised that rx was routed to another provider and she would give [place] 24-48 hours for refills.

## 2012-04-01 NOTE — Telephone Encounter (Signed)
letvak patient

## 2012-04-02 NOTE — Telephone Encounter (Signed)
Rx phoned to pharmacy.  Note deferred to Dr. Alphonsus Sias for review.

## 2012-04-02 NOTE — Telephone Encounter (Signed)
I can do a 10 day supply and then defer to Dr. Alphonsus Sias.  Please call in. Thanks.

## 2012-04-03 NOTE — Telephone Encounter (Signed)
Please call her If she ever walks in again asking for immediate Rx, I will not ever refill it again Her misuse of the narcotics was not our doing, and I have been forced to limit her supply. She should call for refills only---if she stops this negative behavior I may extend to a larger supply. If she ever comes back in demanding an immediate refill, I will permanently discharge her from the practice!!!

## 2012-04-06 NOTE — Telephone Encounter (Signed)
Spoke with patient and advised results   

## 2012-04-09 ENCOUNTER — Other Ambulatory Visit: Payer: Self-pay | Admitting: Family Medicine

## 2012-04-09 NOTE — Telephone Encounter (Signed)
rx called into pharmacy

## 2012-04-22 ENCOUNTER — Other Ambulatory Visit: Payer: Self-pay | Admitting: Internal Medicine

## 2012-04-22 NOTE — Telephone Encounter (Signed)
Pt left v/m requesting refill hydrocodone apap; pt has not got call back from [facility] pain clinic yet.

## 2012-04-23 NOTE — Telephone Encounter (Signed)
Okay #45 x 0 

## 2012-04-23 NOTE — Telephone Encounter (Signed)
rx called into pharmacy

## 2012-05-06 ENCOUNTER — Other Ambulatory Visit: Payer: Self-pay | Admitting: Internal Medicine

## 2012-05-06 ENCOUNTER — Other Ambulatory Visit: Payer: Self-pay

## 2012-05-06 NOTE — Telephone Encounter (Signed)
Pt request refill hydrocodone apap to [facility] [address] Pt has not gotten call back from [facility] pain clinic and request one month rx # 90. Pt had spot on cervix and biopsy done at [facility]; should get results in 2 weeks.

## 2012-05-06 NOTE — Telephone Encounter (Signed)
Duplicate request

## 2012-05-06 NOTE — Telephone Encounter (Signed)
Okay #45 x 0 Not comfortable with more than this yet

## 2012-05-07 MED ORDER — HYDROCODONE-ACETAMINOPHEN 10-325 MG PO TABS: ORAL_TABLET | ORAL | Status: DC

## 2012-05-07 NOTE — Telephone Encounter (Signed)
rx called into pharmacy Spoke with patient and advised results   

## 2012-05-20 ENCOUNTER — Other Ambulatory Visit: Payer: Self-pay | Admitting: Internal Medicine

## 2012-05-20 NOTE — Telephone Encounter (Signed)
Pt checking on status of hydrocodone apap refill to [facility] [address] Pt request call back when med has been called to pharmacy.

## 2012-05-20 NOTE — Telephone Encounter (Signed)
rx called into pharmacy Spoke with patient and she will call back within a month to schedule an appt, pt understands she can't get another refill until she is seen.

## 2012-05-20 NOTE — Telephone Encounter (Signed)
Okay #90 x 0 Since she has not been able to get in with the pain doctors, she will need to come back in to see me before I will fill this anymore I have approved a month's supply though

## 2012-06-02 ENCOUNTER — Telehealth: Payer: Self-pay | Admitting: Internal Medicine

## 2012-06-02 NOTE — Telephone Encounter (Signed)
Called the [facility] to inquire about the patients status of getting an appt. She is still currently on their waiting list with many other people ahead of her. They would not tell me how many ppl and could also not tell me when she would get a call.

## 2012-06-02 NOTE — Telephone Encounter (Signed)
Thanks for the follow up I do need this information in her care (I am still prescribing her pain meds)

## 2012-06-17 ENCOUNTER — Other Ambulatory Visit: Payer: Self-pay | Admitting: Internal Medicine

## 2012-06-17 ENCOUNTER — Other Ambulatory Visit: Payer: Self-pay

## 2012-06-17 MED ORDER — HYDROCODONE-ACETAMINOPHEN 10-325 MG PO TABS: ORAL_TABLET | ORAL | Status: DC

## 2012-06-17 NOTE — Telephone Encounter (Signed)
Pt already scheduled appt with Dr Alphonsus Sias 07/02/12. Pt will be out of hydrocodone-apap tonight. Pt request refill to [facility] [address]. Please advise.

## 2012-06-17 NOTE — Telephone Encounter (Signed)
Duplicate request I did approve #90 x 0

## 2012-06-17 NOTE — Telephone Encounter (Signed)
Okay #90 x 0 

## 2012-06-17 NOTE — Telephone Encounter (Signed)
rx called into pharmacy

## 2012-06-18 NOTE — Telephone Encounter (Signed)
noted 

## 2012-07-02 ENCOUNTER — Ambulatory Visit (INDEPENDENT_AMBULATORY_CARE_PROVIDER_SITE_OTHER): Payer: Self-pay | Admitting: Internal Medicine

## 2012-07-02 ENCOUNTER — Encounter: Payer: Self-pay | Admitting: Internal Medicine

## 2012-07-02 VITALS — BP 100/66 | HR 98 | Temp 97.3°F | Wt 142.0 lb

## 2012-07-02 DIAGNOSIS — F32A Depression, unspecified: Secondary | ICD-10-CM

## 2012-07-02 DIAGNOSIS — F3289 Other specified depressive episodes: Secondary | ICD-10-CM

## 2012-07-02 DIAGNOSIS — F329 Major depressive disorder, single episode, unspecified: Secondary | ICD-10-CM

## 2012-07-02 DIAGNOSIS — M461 Sacroiliitis, not elsewhere classified: Secondary | ICD-10-CM

## 2012-07-02 MED ORDER — CITALOPRAM HYDROBROMIDE 20 MG PO TABS: 20.0000 mg | ORAL_TABLET | Freq: Every day | ORAL | Status: DC

## 2012-07-02 NOTE — Assessment & Plan Note (Signed)
Ongoing problems On waiting list for [facility] clinic---hopefully can get S-I injections again
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
## 2012-07-02 NOTE — Assessment & Plan Note (Signed)
Has gone off meds Ongoing mood issues that are related to pain,etc Will restart the citalopram at lower dose

## 2012-07-15 ENCOUNTER — Other Ambulatory Visit: Payer: Self-pay

## 2012-07-15 MED ORDER — HYDROCODONE-ACETAMINOPHEN 10-325 MG PO TABS: ORAL_TABLET | ORAL | Status: DC

## 2012-07-15 NOTE — Telephone Encounter (Signed)
Pt requesting refill hydrocodone apap to [facility] [address]. Pt will be out of med before Dr Alphonsus Sias returns. Pt has abscess on lower left molar. Pt has been taking hydrocodone q 4-6 hours due to abscess.pt request med called in today.

## 2012-07-15 NOTE — Telephone Encounter (Signed)
plz phone in tomorrow. Will route to PCP so he is aware we will phone in script 2 days early.

## 2012-07-16 NOTE — Telephone Encounter (Signed)
noted 

## 2012-07-16 NOTE — Telephone Encounter (Signed)
Rx called in as directed. Routed to Dr. Alphonsus Sias.

## 2012-08-11 ENCOUNTER — Telehealth: Payer: Self-pay | Admitting: Internal Medicine

## 2012-08-11 ENCOUNTER — Other Ambulatory Visit: Payer: Self-pay | Admitting: Internal Medicine

## 2012-08-11 NOTE — Telephone Encounter (Signed)
Pt states last month she changed pharmacies for her Hydrocodone from [facility] to [facility] because she thought [facility] would be cheaper.   The price ended up being $8.50 more expensive, so pt would like to switch prescription back to [facility] on [address] [place], [place]  [phone].   Prescription will run out this weekend, pt wanted to call in advance due to pharmacy change.  Please call pt with any questions or if there is a problem.

## 2012-08-12 NOTE — Telephone Encounter (Signed)
That is fine Let her know that I appreciate her letting us know (and it is part of the controlled substances agreement)

## 2012-08-12 NOTE — Telephone Encounter (Signed)
Pt called to ck status of hydrocodone apap refill; advised pt called to [facility] and pharmacist said pt could pick up on 08/13/12.

## 2012-08-12 NOTE — Telephone Encounter (Signed)
Okay #90 x 0 

## 2012-08-12 NOTE — Telephone Encounter (Signed)
Pt called for status of hydrocodone apap refill and Patient notified as instructed by telephone.

## 2012-09-09 ENCOUNTER — Other Ambulatory Visit: Payer: Self-pay | Admitting: Internal Medicine

## 2012-09-09 NOTE — Telephone Encounter (Signed)
Pt left v/m requesting refill hydrocodone apap [facility] said needs refilled today and request cb.Please advise.

## 2012-09-10 NOTE — Telephone Encounter (Signed)
Okay #90 x 0 Remind her she can't call at 3:30PM and expect a refill that day

## 2012-09-10 NOTE — Telephone Encounter (Signed)
rx called into pharmacy

## 2012-10-07 ENCOUNTER — Telehealth: Payer: Self-pay | Admitting: Internal Medicine

## 2012-10-07 NOTE — Telephone Encounter (Signed)
Pt left v/m requesting refill status of Hydrocodone. Advised [facility] has requested refill Hydrocodone. Pt said she will ck later today or tomorrow at pharmacy to pick up rx.

## 2012-10-08 NOTE — Telephone Encounter (Signed)
Advised patient as instructed.  She states that she has not gotten any other narcotics in the past 3 months and does not know who these providers are.  She is asking that you call her today.  I explained to her that she will need an appt to come in and discuss and update controlled substance contract.  She says she will run out of the medicine this weekend and is asking that you call her, she states she doesn't know how the information that you have got put on her record.

## 2012-10-08 NOTE — Telephone Encounter (Signed)
Okay to fill #30 x 0 only I will not fill any more until I see her She was told not to get any other meds

## 2012-10-08 NOTE — Telephone Encounter (Signed)
Phoned in to [facility].

## 2012-10-08 NOTE — Telephone Encounter (Signed)
Please let her know that I have seen that she has gotten prescriptions for narcotics from  Daron Offer PA and Dr Kateri Mc within the past 3 months.  She told me she was not going to get narcotics from anyone else. I don't think I can fill her Rx anymore---- if she has explanations, she will need to do it at an appointment and sign our new controlled substance agreement
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
## 2012-10-08 NOTE — Telephone Encounter (Signed)
Pt called to ck on status of refill ;advised sent to [facility]. Advised pt need appt to see Dr Alphonsus Sias before more refills. Pt began to cry again and she said she cannot see him with out paying and she has no money. Advised pt to call to arrange payment plan or ck with social services. Pt said OK.

## 2012-10-08 NOTE — Telephone Encounter (Signed)
Pt called back and has a list from [facility] showing she has not gotten narcotic from any other doctor besides Dr Alphonsus Sias; advised pt she needed to schedule appt and bring that info with her. Pt said she cannot afford an appt. And she will be out of med this weekend and request cb.Please advise.

## 2012-10-09 NOTE — Telephone Encounter (Signed)
She has not been truthful about getting other prescriptions---then called back  I am not sure I will give her any more narcotics---but I certainly won't before discussing with her and doing the controlled substance agreement

## 2012-10-19 ENCOUNTER — Other Ambulatory Visit: Payer: Self-pay | Admitting: Internal Medicine

## 2012-10-19 NOTE — Telephone Encounter (Signed)
Pt left v/m, pt has requested refill hydrocodone thru [facility]. Pt wanted Dr Alphonsus Sias to know that she will not have money for office visit until after first of August. Pt is out of med. Pt request cb to let her know what she was supposed to do.Please advise.

## 2012-10-19 NOTE — Telephone Encounter (Signed)
Pt's mailbox is full and can't accept messages, will try again later.  Pt will need to be seen for refills. Last refill 10/08/2012

## 2012-10-19 NOTE — Telephone Encounter (Signed)
Pt notified as instructed by telephone. Pt said she wants to talk with Dr Alphonsus Sias only. Pt request cb on 10/20/12 at [phone].

## 2012-10-19 NOTE — Telephone Encounter (Signed)
Patient notified as instructed by telephone from 10/19/12 refill note. Pt said she wants to talk with Dr Alphonsus Sias only. Pt request cb on 10/20/12 at [phone].

## 2012-10-20 NOTE — Telephone Encounter (Signed)
I did put it on the snap shot and it should appear at the top of the problem list. I don't know if this helps.

## 2012-10-20 NOTE — Telephone Encounter (Signed)
Dr. Alphonsus Sias- There is no way to put a banner for the patient that says you will no longer prescribe controlled substances, but you can put an FYI in the chart stating no more controlled substances.

## 2012-10-20 NOTE — Telephone Encounter (Signed)
I am not going to refill her narcotics anymore I will call her to notify her

## 2012-10-20 NOTE — Telephone Encounter (Addendum)
Pt said she spoke with Dr Alphonsus Sias and understands he will no longer prescribe narcotics and pt wants to know if Dr Alphonsus Sias could call in alternate medication to [facility]. Pt will not have money for office visit until after first of August. Pt also has not heard from pain mgt clinic and pt wants to know which pain mgt clinic it was.
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
## 2012-10-21 MED ORDER — MELOXICAM 15 MG PO TABS: 15.0000 mg | ORAL_TABLET | Freq: Every day | ORAL | Status: DC

## 2012-10-21 NOTE — Telephone Encounter (Signed)
I am okay with trying meloxicam 15mg  daily #30 x 5 Let her know this is an antiinflammatory like advil or aleve---but once a day and less likely to cause stomach upset She should take it with food

## 2012-10-21 NOTE — Telephone Encounter (Signed)
Spoke with patient and advised results rx sent to pharmacy by e-script  

## 2012-10-21 NOTE — Addendum Note (Signed)
Addended by: Sueanne Margarita on: 10/21/2012 02:11 PM   Modules accepted: Orders

## 2013-01-03 ENCOUNTER — Ambulatory Visit: Payer: Self-pay | Admitting: Internal Medicine

## 2013-03-16 ENCOUNTER — Telehealth: Payer: Self-pay

## 2013-03-16 NOTE — Telephone Encounter (Signed)
She hoped to get in sooner since she will need controlled substance testing (she did have a couple of percocet due to pulled teeth in the past couple of days)   Lyla Son, Bonita Quin can use one of my Friday same day appts this week for her Please call to schedule and cancel the 24th

## 2013-03-16 NOTE — Telephone Encounter (Signed)
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

## 2013-03-16 NOTE — Telephone Encounter (Signed)
Pt request cb from Dr Alphonsus Sias at [phone]. Pt had previously discussed medical condition with Dr Alphonsus Sias. Pt has applied for disability. Pt wants to speak with Dr Alphonsus Sias about being seen by Dr Alphonsus Sias again.

## 2013-03-16 NOTE — Telephone Encounter (Signed)
I spoke with patient and rescheduled her appointment to 03/18/13 at 10:15.

## 2013-03-18 ENCOUNTER — Encounter: Payer: Self-pay | Admitting: Internal Medicine

## 2013-03-18 ENCOUNTER — Ambulatory Visit (INDEPENDENT_AMBULATORY_CARE_PROVIDER_SITE_OTHER): Payer: Self-pay | Admitting: Internal Medicine

## 2013-03-18 ENCOUNTER — Encounter: Payer: Self-pay | Admitting: *Deleted

## 2013-03-18 VITALS — BP 110/70 | HR 86 | Temp 98.0°F | Wt 139.0 lb

## 2013-03-18 DIAGNOSIS — M461 Sacroiliitis, not elsewhere classified: Secondary | ICD-10-CM
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
## 2013-03-18 NOTE — Assessment & Plan Note (Signed)
Vanessa Turner is disabled by this long standing back pain Hasn't sought out other doctors and asks for hydrocodone regularly for her back again Will send for testing---if nothing unexpected, will restart with bid (10/325) and decide about going up to tid

## 2013-03-18 NOTE — Progress Notes (Signed)
Pre-visit discussion using our clinic review tool. No additional management support is needed unless otherwise documented below in the visit note.  

## 2013-03-22 ENCOUNTER — Telehealth: Payer: Self-pay

## 2013-03-22 NOTE — Telephone Encounter (Signed)
Pt left v/m that she was expecting lab results today. Pt request cb.?

## 2013-03-22 NOTE — Telephone Encounter (Signed)
Spoke with patient and advised drug screen not back yet, pt wanted to know if she would be getting a refill before the holidays?

## 2013-03-23 ENCOUNTER — Ambulatory Visit: Payer: Self-pay | Admitting: Internal Medicine

## 2013-03-23 MED ORDER — HYDROCODONE-ACETAMINOPHEN 10-325 MG PO TABS: 1.0000 | ORAL_TABLET | Freq: Two times a day (BID) | ORAL | Status: DC | PRN

## 2013-03-23 NOTE — Telephone Encounter (Signed)
spoke to pt and she is aware of Rx being ready for pick up. Per Dr Alphonsus Sias drug screen came back "low risk" will send results to be scanned

## 2013-03-23 NOTE — Telephone Encounter (Signed)
Can you check to see if her assured toxicology report is back

## 2013-04-13 ENCOUNTER — Encounter: Payer: Self-pay | Admitting: Internal Medicine

## 2013-04-19 ENCOUNTER — Other Ambulatory Visit: Payer: Self-pay

## 2013-04-19 MED ORDER — HYDROCODONE-ACETAMINOPHEN 10-325 MG PO TABS: 1.0000 | ORAL_TABLET | Freq: Three times a day (TID) | ORAL | Status: DC | PRN

## 2013-04-19 NOTE — Telephone Encounter (Signed)
Mailbox is full and could not leave a message, will try again later

## 2013-04-19 NOTE — Telephone Encounter (Signed)
Pt request rx for Hydrocodone apap. Call when ready for pick up. Pt is out of medicine. Pt said she understood that pt could take 3 times a day as needed but instructions on bottle say may take 2 times a day as needed; pt wanted to clarify directions. Pt does not have insurance and pt said found cheapest price for hydrocodone apap at [facility]. Pt wanted Dr Alphonsus SiasLetvak to know that is why pt changed pharmacies.

## 2013-04-19 NOTE — Telephone Encounter (Signed)
I changed it to tid prn but still want to keep the quantity to #60 for now

## 2013-04-19 NOTE — Telephone Encounter (Signed)
Spoke with patient and advised rx ready for pick-up and it will be at the front desk.  

## 2013-05-11 ENCOUNTER — Other Ambulatory Visit: Payer: Self-pay

## 2013-05-11 NOTE — Telephone Encounter (Signed)
Pt left v/m requesting rx hydrocodone apap. Pt request cb when ready for pick up. Pt said she takes Hydrocodone apap three times a day and last rx # 60 was only 20 day supply. Pt request 30 day supply or #90 for this rx.Please advise.Pt has 2 pills left.

## 2013-05-12 MED ORDER — HYDROCODONE-ACETAMINOPHEN 10-325 MG PO TABS: 1.0000 | ORAL_TABLET | Freq: Three times a day (TID) | ORAL | Status: DC | PRN

## 2013-05-12 NOTE — Telephone Encounter (Signed)
Pt left v/m checking on status of hydrocodone apap rx. Pt request cb when ready for pick up.

## 2013-05-12 NOTE — Telephone Encounter (Signed)
Spoke with patient and advised rx ready for pick-up and it will be at the front desk. Also advised pt that she can not get another refill if she fills somewhere else

## 2013-05-12 NOTE — Telephone Encounter (Signed)
Let her know that I am checking the state website every time She had 4 different small narcotic prescriptions in December--- 3 from the dentist If I ever see another one, I will stop giving her meds!

## 2013-06-07 ENCOUNTER — Other Ambulatory Visit: Payer: Self-pay | Admitting: Internal Medicine

## 2013-06-07 MED ORDER — HYDROCODONE-ACETAMINOPHEN 10-325 MG PO TABS: 1.0000 | ORAL_TABLET | Freq: Three times a day (TID) | ORAL | Status: DC | PRN

## 2013-06-07 NOTE — Telephone Encounter (Signed)
Pt is needing refill on Hydrocodone. Please advise °

## 2013-06-07 NOTE — Telephone Encounter (Signed)
Pt came by office and picked her rx up. I had her sign for it as well.

## 2013-06-17 ENCOUNTER — Telehealth: Payer: Self-pay | Admitting: Internal Medicine
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
## 2013-06-17 NOTE — Telephone Encounter (Signed)
Please check on her on Monday 

## 2013-06-20 NOTE — Telephone Encounter (Signed)
Pt will call if needed for an appt

## 2013-07-05 ENCOUNTER — Other Ambulatory Visit: Payer: Self-pay

## 2013-07-05 MED ORDER — HYDROCODONE-ACETAMINOPHEN 10-325 MG PO TABS: 1.0000 | ORAL_TABLET | Freq: Three times a day (TID) | ORAL | Status: DC | PRN

## 2013-07-05 NOTE — Telephone Encounter (Signed)
Spoke with patient and advised rx ready for pick-up and it will be at the front desk.  

## 2013-07-05 NOTE — Telephone Encounter (Signed)
Pt left v/m requesting rx hydrocodone apap. Call when ready for pick up.  

## 2013-07-19 IMAGING — CR DG CHEST 1V PORT
1 series · 1 of 1 positions shown · non-contrast
Comparison: none

REASON FOR EXAM: intubated
COMMENTS:

[ap]
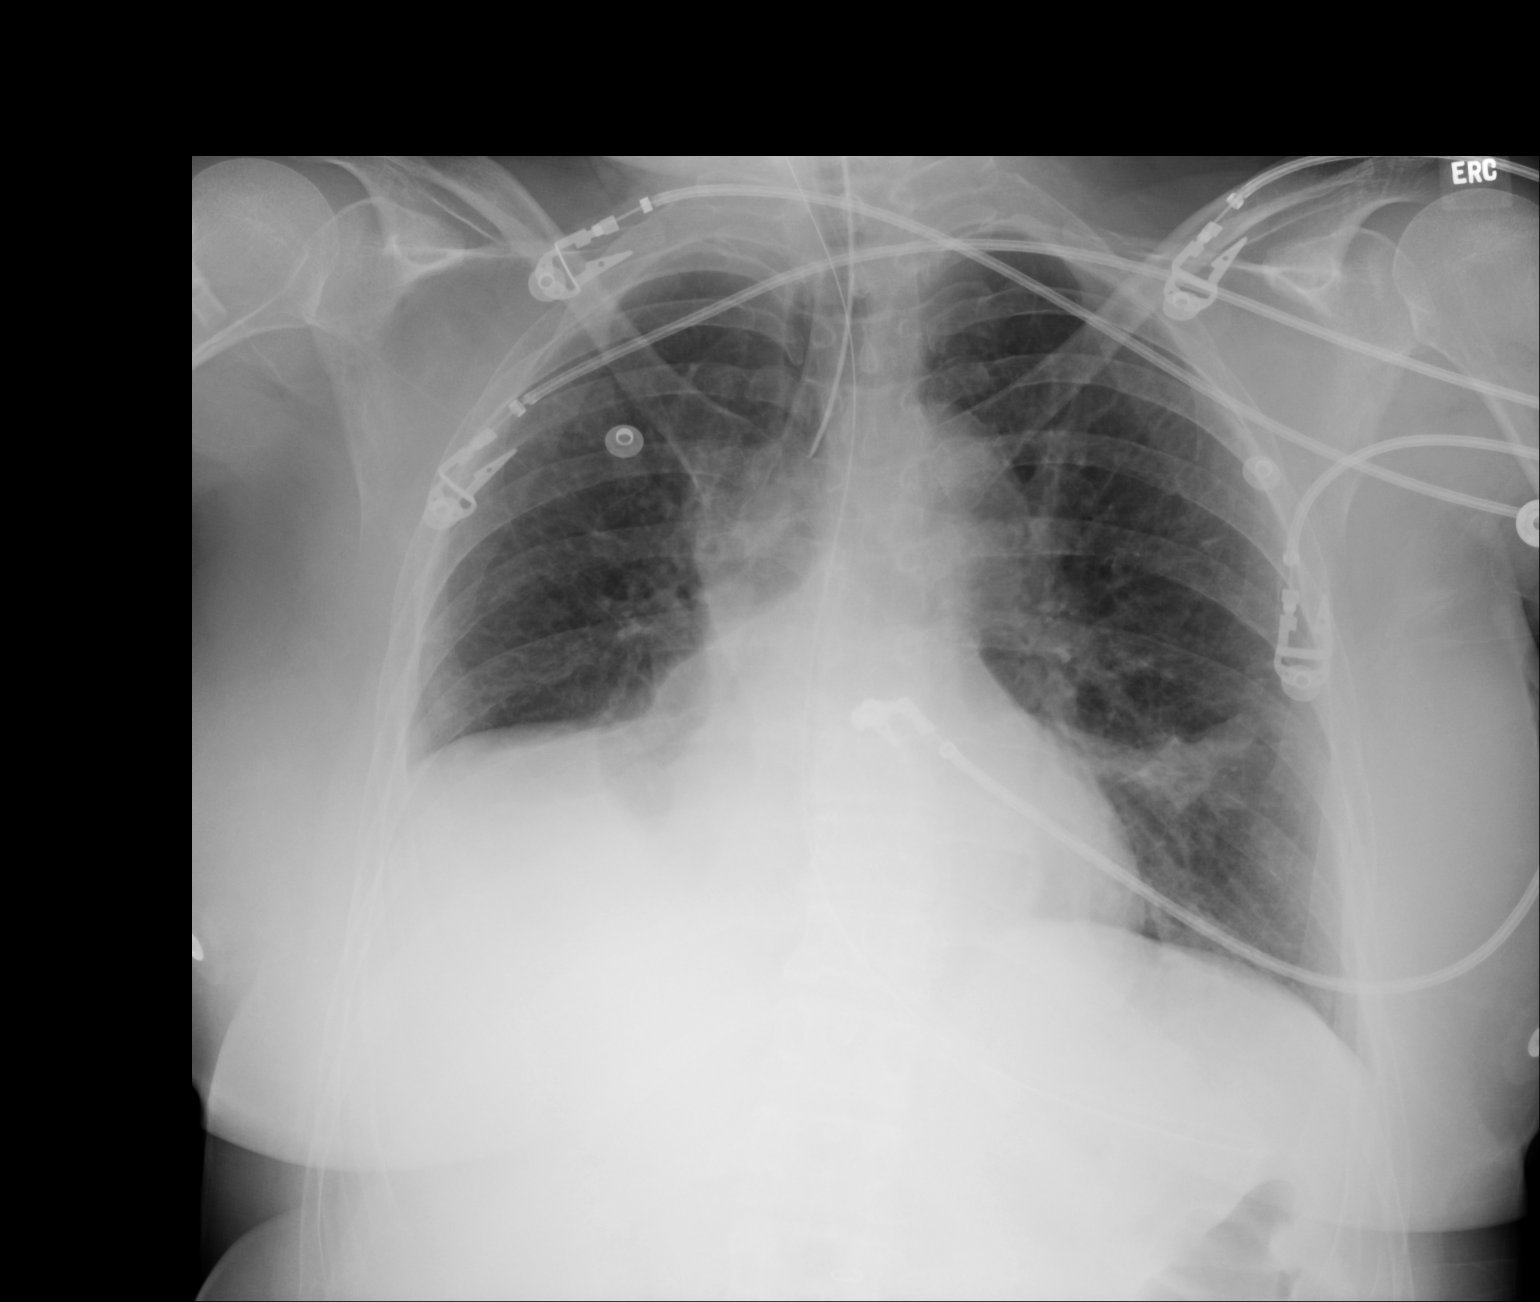

[1 of 1 positions shown; findings below may reference images not displayed]

PROCEDURE:     DXR - DXR PORTABLE CHEST SINGLE VIEW  - January 27, 2012  [DATE]

RESULT:     Frontal view of the chest is performed. Comparison is made to
prior study dated 01/26/2012.

Endotracheal tube has been placed in the interim with the tip at the level
of the clavicles. NG tube is seen with the tip not on the view of this
study. There is elevation of the right hemidiaphragm and what appears to be
possible consolidative density, right lung base. There is prominence of the
interstitial markings and peribronchial cuffing. Area of increased density
projects in the left lung base. The visualized bony skeleton is
unremarkable. The cardiac silhouette is unremarkable.
IMPRESSION: 1.  Atelectasis versus infiltrate, left lung base.
2.  Support tubes as described above.
3.  Surveillance evaluation recommended.

## 2013-08-01 ENCOUNTER — Other Ambulatory Visit: Payer: Self-pay

## 2013-08-01 MED ORDER — HYDROCODONE-ACETAMINOPHEN 10-325 MG PO TABS: 1.0000 | ORAL_TABLET | Freq: Three times a day (TID) | ORAL | Status: DC | PRN

## 2013-08-01 NOTE — Telephone Encounter (Signed)
Pt left v/m requesting rx hydrocodone apap. Call when ready for pick up.  

## 2013-08-01 NOTE — Telephone Encounter (Signed)
Spoke with patient and advised rx ready for pick-up and it will be at the front desk.  

## 2013-08-31 ENCOUNTER — Other Ambulatory Visit: Payer: Self-pay

## 2013-08-31 MED ORDER — HYDROCODONE-ACETAMINOPHEN 10-325 MG PO TABS: 1.0000 | ORAL_TABLET | Freq: Three times a day (TID) | ORAL | Status: DC | PRN

## 2013-08-31 NOTE — Telephone Encounter (Signed)
Spoke with patient and advised rx ready for pick-up and it will be at the front desk.  

## 2013-08-31 NOTE — Telephone Encounter (Signed)
Pt left v/m requesting rx hydrocodone apap. Call when ready for pick up.  

## 2013-09-26 ENCOUNTER — Other Ambulatory Visit: Payer: Self-pay

## 2013-09-26 ENCOUNTER — Encounter: Payer: Self-pay | Admitting: Internal Medicine

## 2013-09-26 MED ORDER — HYDROCODONE-ACETAMINOPHEN 10-325 MG PO TABS
1.0000 | ORAL_TABLET | Freq: Three times a day (TID) | ORAL | Status: DC | PRN
Start: 2013-09-26 — End: 2014-02-08

## 2013-09-26 NOTE — Telephone Encounter (Signed)
Spoke with patient and advised rx ready for pick-up and it will be at the front desk.  

## 2013-09-26 NOTE — Telephone Encounter (Signed)
Pt left v/m requesting rx hydrocodone apap. Call when ready for pick up. Pt calling early due to July 4th holiday.

## 2013-09-26 NOTE — Telephone Encounter (Signed)
Refill done though we will be here till Thursday  Next refill won't be due till about August 2nd

## 2013-10-26 ENCOUNTER — Other Ambulatory Visit: Payer: Self-pay | Admitting: Family Medicine

## 2013-10-26 ENCOUNTER — Telehealth: Payer: Self-pay | Admitting: Internal Medicine

## 2013-10-26 NOTE — Telephone Encounter (Signed)
Spoke with patient and advised results, she also states she was taking seroquel, I advised again that Dr. Alphonsus SiasLetvak can not refill the medication, pt is asking for a 10day supply until she can come back in. I stated again that Dr. Alphonsus SiasLetvak can not refill the medication, pt is not taking no for an answer.
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
## 2013-10-26 NOTE — Telephone Encounter (Signed)
Pt called wanting to get dee call her back Pt stated dee know what it is regarding

## 2013-10-26 NOTE — Telephone Encounter (Signed)
Spoke with patient and advised results, pt wanted to know everything that's in her drug screen and I explained as best I could and in the end pt just said ok.

## 2013-10-26 NOTE — Telephone Encounter (Signed)
Denied Has benzodiazepines in her drug screen that there is no record of prescription for

## 2013-10-26 NOTE — Telephone Encounter (Signed)
Yes she used to have soma but not the benzodiazepine. This is clearly a violation of the agreement and I can't refill the med

## 2013-10-26 NOTE — Telephone Encounter (Signed)
Last filled 09/26/13

## 2013-10-26 NOTE — Telephone Encounter (Signed)
See refill request 10/26/13

## 2013-10-26 NOTE — Telephone Encounter (Signed)
I will not refill it and I am not suggesting a visit ---because she has used up her last chance already.

## 2013-11-07 ENCOUNTER — Encounter: Payer: Self-pay | Admitting: Internal Medicine

## 2014-02-03 ENCOUNTER — Ambulatory Visit: Payer: Self-pay | Admitting: Internal Medicine

## 2014-02-08 ENCOUNTER — Encounter: Payer: Self-pay | Admitting: Internal Medicine

## 2014-02-08 ENCOUNTER — Ambulatory Visit (INDEPENDENT_AMBULATORY_CARE_PROVIDER_SITE_OTHER): Payer: Self-pay | Admitting: Internal Medicine

## 2014-02-08 VITALS — BP 120/80 | HR 96 | Temp 98.0°F | Wt 155.0 lb

## 2014-02-08 DIAGNOSIS — F339 Major depressive disorder, recurrent, unspecified: Secondary | ICD-10-CM

## 2014-02-08 MED ORDER — ESCITALOPRAM OXALATE 10 MG PO TABS
10.0000 mg | ORAL_TABLET | Freq: Every day | ORAL | Status: DC
Start: 2014-02-08 — End: 2014-02-13

## 2014-02-08 NOTE — Progress Notes (Signed)
Pre visit review using our clinic review tool, if applicable. No additional management support is needed unless otherwise documented below in the visit note. 

## 2014-02-08 NOTE — Patient Instructions (Signed)
Please check into the cash price for venlafaxine 75 or 150mg  and duloxetine 30 or 60mg .

## 2014-02-08 NOTE — Assessment & Plan Note (Signed)
With concomitant chronic pain syndrome Can't Rx pain relievers--discussed again Will restart her escitalopram Consider adding venlafaxine again (she responded to combination) or consider duloxetine
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
## 2014-02-10 ENCOUNTER — Telehealth: Payer: Self-pay

## 2014-02-10 NOTE — Telephone Encounter (Signed)
Pt left v/m requesting substitute med that would be less expensive than lexapro; lexapro was $81.00 and pt did not pick up med due to expense[facility]Please advise.

## 2014-02-11 NOTE — Telephone Encounter (Signed)
She has been on citalopram in the past also and that should be much cheaper Send Rx for citalopram 20mg  #30 x 11 Have her start with 1/2 tab for the first 4 days, then go up to a full tab if she has no problems with it

## 2014-02-13 MED ORDER — ESCITALOPRAM OXALATE 10 MG PO TABS: 10.0000 mg | ORAL_TABLET | Freq: Every day | ORAL | Status: AC

## 2014-02-13 NOTE — Telephone Encounter (Signed)
Spoke with patient and she doesn't want the celexa becasuse it makes her "ill" [facility] she can get [facility] for $9.00 with a coupon.  rx sent to pharmacy by e-script

## 2014-02-14 ENCOUNTER — Telehealth: Payer: Self-pay | Admitting: Internal Medicine

## 2014-02-14 NOTE — Telephone Encounter (Signed)
emmi emailed °

## 2014-02-28 ENCOUNTER — Emergency Department: Payer: Self-pay | Admitting: Emergency Medicine

## 2014-03-30 ENCOUNTER — Emergency Department: Payer: Self-pay | Admitting: Internal Medicine

## 2014-04-10 ENCOUNTER — Ambulatory Visit: Payer: Self-pay | Admitting: Internal Medicine

## 2014-04-10 ENCOUNTER — Telehealth: Payer: Self-pay | Admitting: Internal Medicine

## 2014-04-10 NOTE — Telephone Encounter (Signed)
Patient did not come for their scheduled appointment today for 2 month follow up.  Please let me know if the patient needs to be contacted immediately for follow up or if no follow up is necessary.   ° °

## 2014-04-10 NOTE — Telephone Encounter (Signed)
I don[place] think [place]  No follow up unless she requests No no show fee please

## 2014-04-14 ENCOUNTER — Telehealth: Payer: Self-pay | Admitting: *Deleted

## 2014-04-14 NOTE — Telephone Encounter (Signed)
Lm on pts vm requesting a call back if wanting to schedule flu shot. 

## 2014-05-06 ENCOUNTER — Other Ambulatory Visit: Payer: Self-pay | Admitting: Emergency Medicine

## 2014-05-09 ENCOUNTER — Telehealth: Payer: Self-pay | Admitting: Internal Medicine

## 2014-05-09 NOTE — Telephone Encounter (Signed)
Patient's sister called to let you know patient passed away on 05/24/2014.  [facility].  Patient had pneumonia in both lungs and she was septic.

## 2014-05-10 NOTE — Telephone Encounter (Signed)
I saw her obituary. That is very sad

## 2014-05-30 DEATH — deceased

## 2015-10-27 IMAGING — CR DG CHEST 1V PORT
1 series · 1 of 1 positions shown · non-contrast
Comparison: 02/02/2012

CLINICAL DATA: Diffuse myalgia, cyanosis

EXAM:
PORTABLE CHEST - 1 VIEW

[ap]
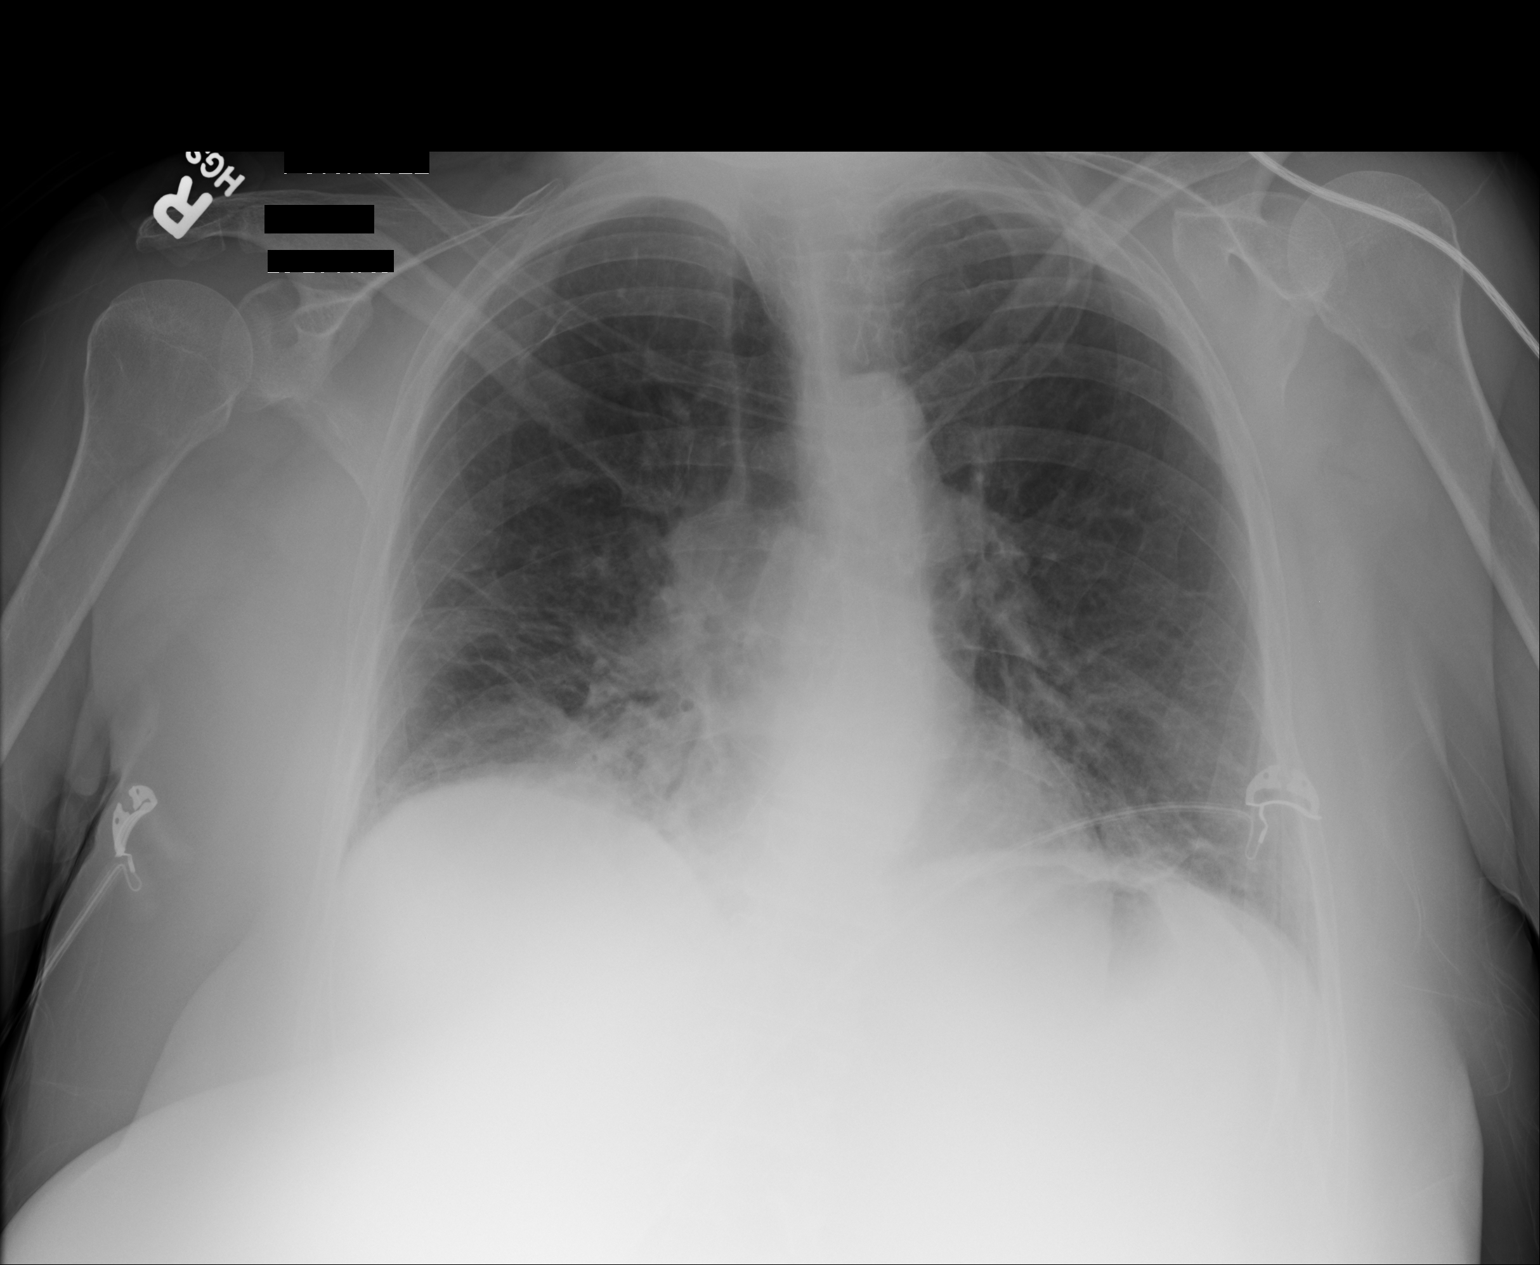

[1 of 1 positions shown; findings below may reference images not displayed]

FINDINGS: Lungs are hypoaerated. Patient is rotated to the right. Patchy
bilateral lower lobe airspace opacities are present. No pleural
effusion. Heart size upper limits of normal. No pneumothorax.
Curvilinear right upper lobe scarring or clothing artifact.
IMPRESSION: Hypoaeration with patchy bibasilar airspace opacities which could
represent atelectasis given hypoaeration although pneumonia or other
alveolar filling processes could appear similar. If symptoms
persist, consider PA and lateral chest radiographs obtained at full
inspiration when the patient is clinically able.
